# Patient Record
Sex: Female | Born: 1995 | Race: White | Hispanic: No | Marital: Single | Smoking: Never smoker
Health system: Southern US, Community
[De-identification: ages and names within clinical notes are randomized; demographics above are authoritative.]

---
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
## 2017-05-18 ENCOUNTER — Encounter (HOSPITAL_COMMUNITY): Payer: Self-pay

## 2017-05-18 ENCOUNTER — Other Ambulatory Visit: Payer: Self-pay

## 2017-05-18 ENCOUNTER — Emergency Department (HOSPITAL_COMMUNITY): Payer: Self-pay

## 2017-05-18 ENCOUNTER — Emergency Department (HOSPITAL_COMMUNITY)
Admission: EM | Admit: 2017-05-18 | Discharge: 2017-05-18 | Disposition: A | Discharge status: Home / Self Care | Payer: Self-pay | Attending: Emergency Medicine | Admitting: Emergency Medicine

## 2017-05-18 DIAGNOSIS — K529 Noninfective gastroenteritis and colitis, unspecified: Secondary | ICD-10-CM | POA: Insufficient documentation

## 2017-05-18 DIAGNOSIS — R1030 Lower abdominal pain, unspecified: Secondary | ICD-10-CM | POA: Insufficient documentation

## 2017-05-18 LAB — LIPASE, BLOOD: Lipase: 28 U/L (ref 11–51)

## 2017-05-18 LAB — CBC
HEMATOCRIT: 44.1 % (ref 36.0–46.0)
Hemoglobin: 14.2 g/dL (ref 12.0–15.0)
MCH: 27.2 pg (ref 26.0–34.0)
MCHC: 32.2 g/dL (ref 30.0–36.0)
MCV: 84.5 fL (ref 78.0–100.0)
Platelets: 338 10*3/uL (ref 150–400)
RBC: 5.22 MIL/uL — ABNORMAL HIGH (ref 3.87–5.11)
RDW: 14.4 % (ref 11.5–15.5)
WBC: 9.4 10*3/uL (ref 4.0–10.5)

## 2017-05-18 LAB — I-STAT BETA HCG BLOOD, ED (MC, WL, AP ONLY)

## 2017-05-18 LAB — COMPREHENSIVE METABOLIC PANEL
ALK PHOS: 65 U/L (ref 38–126)
ALT: 15 U/L (ref 14–54)
AST: 21 U/L (ref 15–41)
Albumin: 4.1 g/dL (ref 3.5–5.0)
Anion gap: 10 (ref 5–15)
BILIRUBIN TOTAL: 0.8 mg/dL (ref 0.3–1.2)
BUN: 10 mg/dL (ref 6–20)
CALCIUM: 9.3 mg/dL (ref 8.9–10.3)
CO2: 24 mmol/L (ref 22–32)
Chloride: 104 mmol/L (ref 101–111)
Creatinine, Ser: 0.77 mg/dL (ref 0.44–1.00)
GFR calc Af Amer: >60 mL/min (ref >60)
GFR calc non Af Amer: >60 mL/min (ref >60)
GLUCOSE: 122 mg/dL — AB (ref 65–99)
POTASSIUM: 4.6 mmol/L (ref 3.5–5.1)
Sodium: 138 mmol/L (ref 135–145)
TOTAL PROTEIN: 7.2 g/dL (ref 6.5–8.1)

## 2017-05-18 LAB — URINALYSIS, ROUTINE W REFLEX MICROSCOPIC
BACTERIA UA: NONE SEEN
Bilirubin Urine: NEGATIVE
Glucose, UA: NEGATIVE mg/dL
Ketones, ur: NEGATIVE mg/dL
Leukocytes, UA: NEGATIVE
NITRITE: NEGATIVE
PROTEIN: NEGATIVE mg/dL
SPECIFIC GRAVITY, URINE: 1.023 (ref 1.005–1.030)
pH: 6 (ref 5.0–8.0)

## 2017-05-18 MED ORDER — ACETAMINOPHEN 500 MG PO TABS
1000.0000 mg | ORAL_TABLET | Freq: Once | ORAL | Status: AC
  Administered 2017-05-18: 1000 mg via ORAL
  Filled 2017-05-18: qty 2

## 2017-05-18 MED ORDER — PROMETHAZINE HCL 25 MG PO TABS: 25.0000 mg | ORAL_TABLET | Freq: Four times a day (QID) | ORAL | 0 refills | Status: AC | PRN

## 2017-05-18 MED ORDER — PROMETHAZINE HCL 25 MG/ML IJ SOLN
25.0000 mg | Freq: Once | INTRAMUSCULAR | Status: AC
  Administered 2017-05-18: 25 mg via INTRAVENOUS
  Filled 2017-05-18: qty 1

## 2017-05-18 MED ORDER — SODIUM CHLORIDE 0.9 % IV SOLN
Freq: Once | INTRAVENOUS | Status: AC
  Administered 2017-05-18: 22:00:00 via INTRAVENOUS

## 2017-05-18 MED ORDER — SODIUM CHLORIDE 0.9 % IV BOLUS (SEPSIS)
2000.0000 mL | Freq: Once | INTRAVENOUS | Status: AC
  Administered 2017-05-18: 2000 mL via INTRAVENOUS

## 2017-05-18 MED ORDER — SODIUM CHLORIDE 0.9 % IV BOLUS (SEPSIS): 1000.0000 mL | Freq: Once | INTRAVENOUS | Status: DC

## 2017-05-18 MED ORDER — ONDANSETRON 4 MG PO TBDP
4.0000 mg | ORAL_TABLET | Freq: Once | ORAL | Status: AC | PRN
  Administered 2017-05-18: 4 mg via ORAL
  Filled 2017-05-18: qty 1

## 2017-05-18 MED ORDER — IOPAMIDOL (ISOVUE-300) INJECTION 61%
INTRAVENOUS | Status: AC
  Administered 2017-05-18: 100 mL
  Filled 2017-05-18: qty 100

## 2017-05-18 NOTE — ED Triage Notes (Signed)
Patient awoke 0500 with abdominal cramping and multiple episodes of vomiting. One episode of loose stool. Patient alert and oriented, states currently only nausea no pain.

## 2017-05-18 NOTE — ED Provider Notes (Signed)
[facility] Provider Note   CSN: [id number] Arrival date & time: 05/18/17  1227     History   Chief Complaint No chief complaint on file.   HPI Isabel Mccall is a 22 y.o. female.  HPI Patient presents to the emergency department with nausea vomiting abdominal cramping since around 5 AM.  The patient did not take any medications prior to arrival.  She states that her nausea continues even though she has not vomited in several hours.  The patient states that nothing seems to make the condition better or worse.  The patient denies chest pain, shortness of breath, headache,blurred vision, neck pain, fever, cough, weakness, numbness, dizziness, anorexia, edema,rash, back pain, dysuria, hematemesis, bloody stool, near syncope, or syncope. History reviewed. No pertinent past medical history.  There are no active problems to display for this patient.   History reviewed. No pertinent surgical history.   OB History   None      Home Medications    Prior to Admission medications   Medication Sig Start Date End Date Taking? Authorizing Provider  promethazine (PHENERGAN) 25 MG tablet Take 1 tablet (25 mg total) by mouth every 6 (six) hours as needed for nausea or vomiting. 05/18/17   Krithi Bray, Cristal Deerhristopher, PA-C    Family History No family history on file.  Social History Social History   Tobacco Use  . Smoking status: Never Smoker  . Smokeless tobacco: Never Used  Substance Use Topics  . Alcohol use: Not on file  . Drug use: Not on file     Allergies   Patient has no known allergies.   Review of Systems Review of Systems All other systems negative except as documented in the HPI. All pertinent positives and negatives as reviewed in the HPI.  Physical Exam Updated Vital Signs BP 124/86 (BP Location: Right Arm)   Pulse (!) 116   Temp (!) 100.8 F (38.2 C) (Oral)   Resp 16   Ht 5' 2.5" (1.588 m)   Wt 116.1 kg (256 lb)   SpO2  100%   BMI 46.08 kg/m   Physical Exam  Constitutional: She is oriented to person, place, and time. She appears well-developed and well-nourished. No distress.  HENT:  Head: Normocephalic and atraumatic.  Mouth/Throat: Oropharynx is clear and moist.  Eyes: Pupils are equal, round, and reactive to light.  Neck: Normal range of motion. Neck supple.  Cardiovascular: Normal rate, regular rhythm and normal heart sounds. Exam reveals no gallop and no friction rub.  No murmur heard. Pulmonary/Chest: Effort normal and breath sounds normal. No respiratory distress. She has no wheezes.  Abdominal: Soft. Bowel sounds are normal. She exhibits no distension and no mass. There is tenderness. There is no guarding.  Neurological: She is alert and oriented to person, place, and time. She exhibits normal muscle tone. Coordination normal.  Skin: Skin is warm and dry. Capillary refill takes less than 2 seconds. No rash noted. No erythema.  Psychiatric: She has a normal mood and affect. Her behavior is normal.  Nursing note and vitals reviewed.    ED Treatments / Results  Labs (all labs ordered are listed, but only abnormal results are displayed) Labs Reviewed  COMPREHENSIVE METABOLIC PANEL - Abnormal; Notable for the following components:      Result Value   Glucose, Bld 122 (*)    All other components within normal limits  CBC - Abnormal; Notable for the following components:   RBC 5.22 (*)  All other components within normal limits  URINALYSIS, ROUTINE W REFLEX MICROSCOPIC - Abnormal; Notable for the following components:   APPearance HAZY (*)    Hgb urine dipstick LARGE (*)    Squamous Epithelial / LPF 6-30 (*)    All other components within normal limits  LIPASE, BLOOD  I-STAT BETA HCG BLOOD, ED ([facility], [facility], [facility] ONLY)    EKG None  Radiology Ct Abdomen Pelvis W Contrast  Result Date: 05/18/2017 CLINICAL DATA:  Abdominal cramping with vomiting and epigastric pain radiating to back. EXAM: CT  ABDOMEN AND PELVIS WITH CONTRAST TECHNIQUE: Multidetector CT imaging of the abdomen and pelvis was performed using the standard protocol following bolus administration of intravenous contrast. CONTRAST:  ISOVUE-300 IOPAMIDOL (ISOVUE-300) INJECTION 61% COMPARISON:  None. FINDINGS: Lower chest: Normal. Hepatobiliary: Normal. Pancreas: Normal. Spleen: Normal. Adrenals/Urinary Tract: Adrenal glands are normal. Kidneys normal in size without hydronephrosis or nephrolithiasis. There is a 1 cm hypodensity over the upper pole cortex of the left kidney too small to characterize but likely a cyst. Ureters and bladder are normal. Stomach/Bowel: Stomach and small bowel are normal. Appendix is normal. Colon is normal. Vascular/Lymphatic: Normal. Reproductive: IUD in adequate position. Uterus and ovaries are otherwise unremarkable. Other: No free fluid or focal inflammatory change. Musculoskeletal: Normal. IMPRESSION: No acute findings in the abdomen/pelvis. 1 cm left renal cortical hypodensity too small to characterize but likely a cyst. Electronically Signed   By: Elberta Fortis M.D.   On: 05/18/2017 20:06    Procedures Procedures (including critical care time)  Medications Ordered in ED Medications  ondansetron (ZOFRAN-ODT) disintegrating tablet 4 mg (4 mg Oral Given 05/18/17 1250)  promethazine (PHENERGAN) injection 25 mg (25 mg Intravenous Given 05/18/17 1901)  sodium chloride 0.9 % bolus 2,000 mL (0 mLs Intravenous Stopped 05/18/17 2151)  iopamidol (ISOVUE-300) 61 % injection (100 mLs  Contrast Given 05/18/17 1917)  0.9 %  sodium chloride infusion ( Intravenous Stopped 05/18/17 2317)  acetaminophen (TYLENOL) tablet 1,000 mg (1,000 mg Oral Given 05/18/17 2205)     Initial Impression / Assessment and Plan / ED Course  I have reviewed the triage vital signs and the nursing notes.  Pertinent labs & imaging results that were available during my care of the patient were reviewed by me and considered in my  medical decision making (see chart for details).    Patient laboratory testing did not show any significant abnormalities and review of her CT scan did not show any significant abnormalities at this time.  I feel she is dealing with gastroenteritis and advised her to slowly increase her fluid intake and rest as much as possible.  I did advise her this could be an evolving process that is what fully declare itself.  Told to return for any worsening in her condition.  She agrees the plan and all questions were answered.  She does feel significantly better with no pain and no nausea at this time.  Patient is tolerated oral fluids  Final Clinical Impressions(s) / ED Diagnoses   Final diagnoses:  Gastroenteritis    ED Discharge Orders        Ordered    promethazine (PHENERGAN) 25 MG tablet  Every 6 hours PRN     05/18/17 2155       Charlestine Night, PA-C 05/19/17 0001    Rolan Bucco, MD 05/19/17 [phone]

## 2017-05-18 NOTE — ED Notes (Signed)
Upon DC pt HR noted to be upper 120's and pt had low grade fever, EDP aware. Verbal for tylenol and another liter NS.

## 2017-05-18 NOTE — Discharge Instructions (Addendum)
Return here as needed. Slowly increase your fluid intake. Rest as much as possible. °

## 2018-12-16 IMAGING — CT CT ABD-PELV W/ CM
2 of 4 series · 17 of 46 positions shown, 19 images · IV contrast (iopamidol)
Comparison: None.

CLINICAL DATA: Abdominal cramping with vomiting and epigastric pain
radiating to back.

EXAM:
CT ABDOMEN AND PELVIS WITH CONTRAST
TECHNIQUE: Multidetector CT imaging of the abdomen and pelvis was performed
using the standard protocol following bolus administration of
intravenous contrast.
CONTRAST:  100mL 4MYH3M-7AA IOPAMIDOL (4MYH3M-7AA) INJECTION 61%

[Series 3: a/p w/ 5mm · axial · 0.98mm/px · z∈[+801,+1246]mm · 14 of 97 slices shown, 16 images]
[im 4/97  soft-tissue]
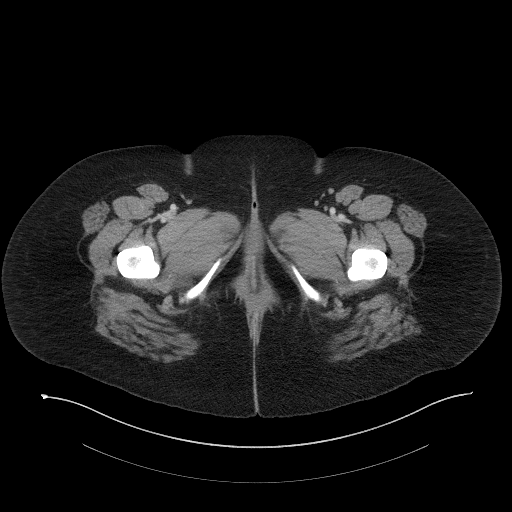
[im 4/97  bone]
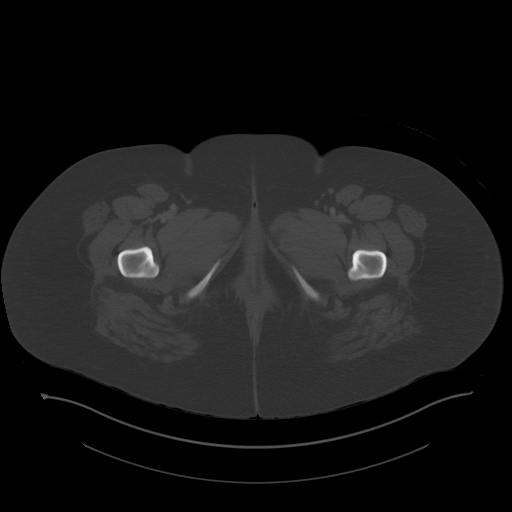
[im 12/97  soft-tissue]
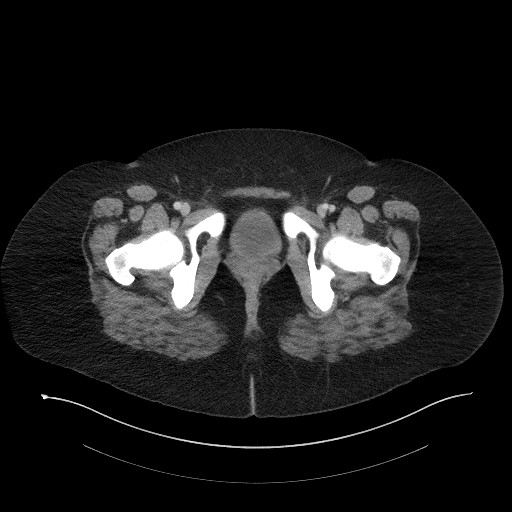
[im 20/97  soft-tissue]
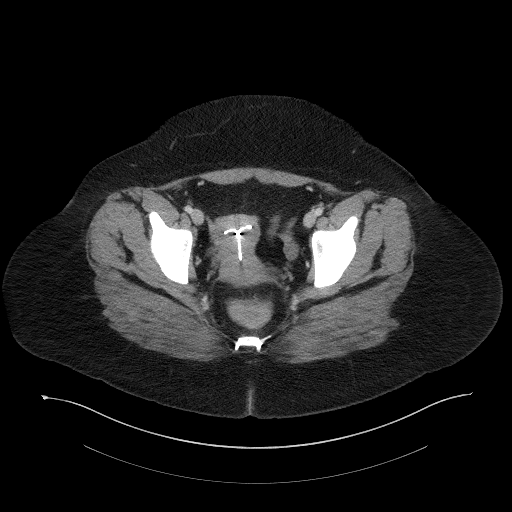
[im 27/97  soft-tissue]
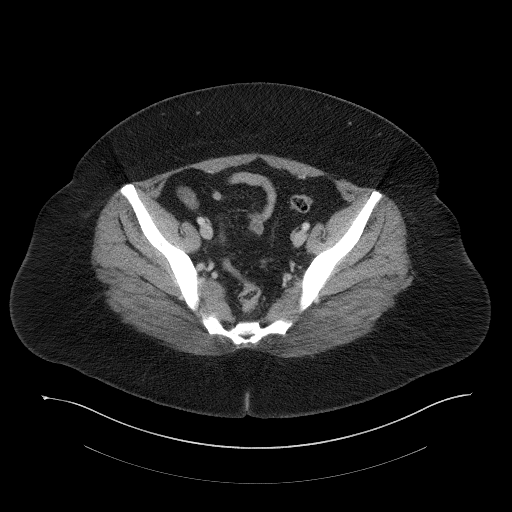
[im 31/97  soft-tissue]
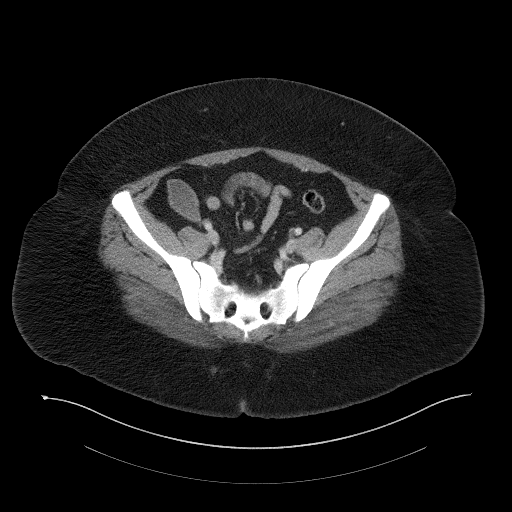
[im 39/97  soft-tissue]
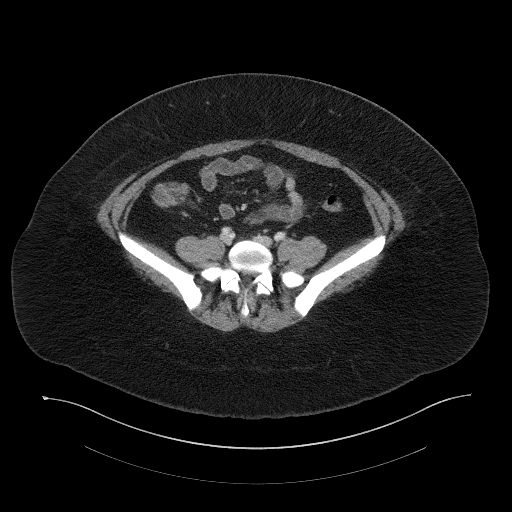
[im 47/97  soft-tissue]
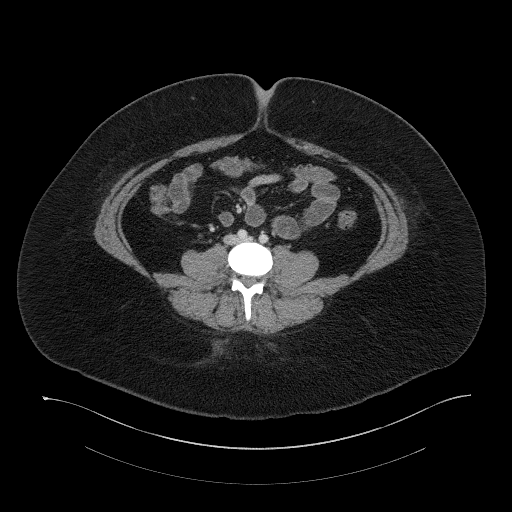
[im 50/97  soft-tissue]
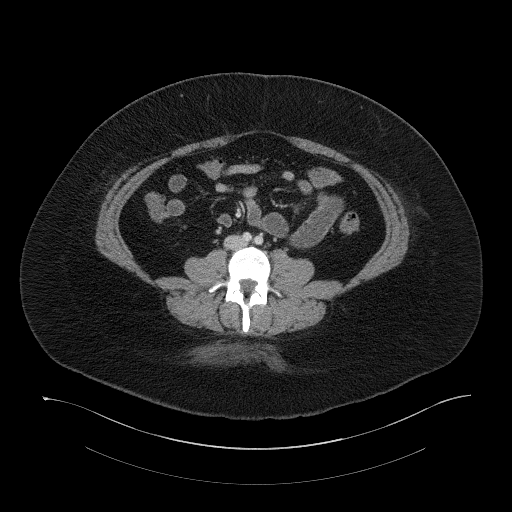
[im 58/97  soft-tissue]
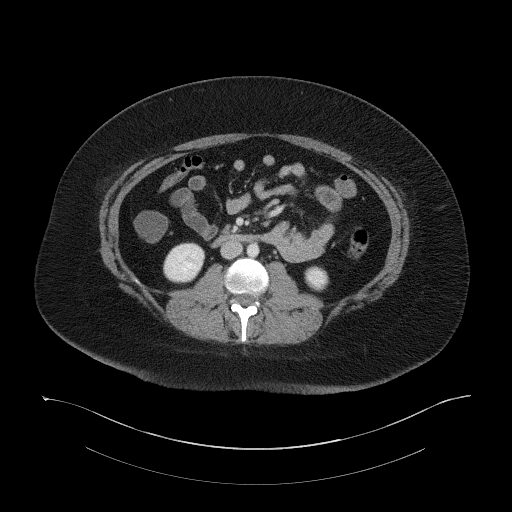
[im 58/97  bone]
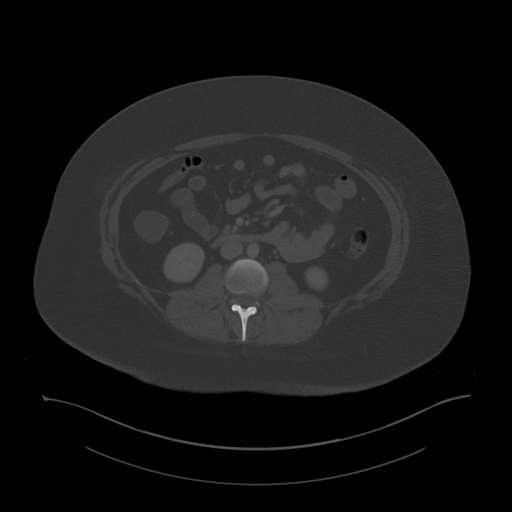
[im 66/97  soft-tissue]
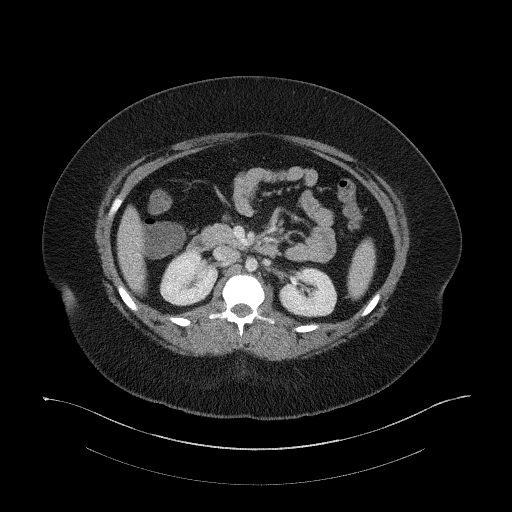
[im 73/97  soft-tissue]
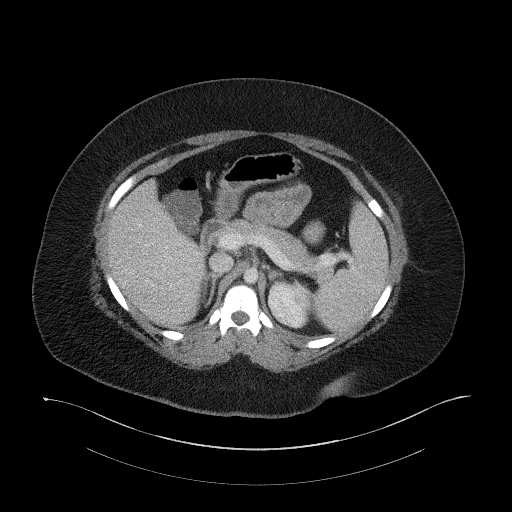
[im 77/97  soft-tissue]
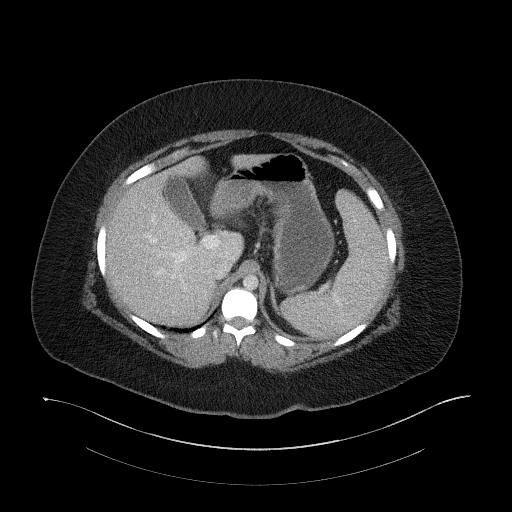
[im 85/97  soft-tissue]
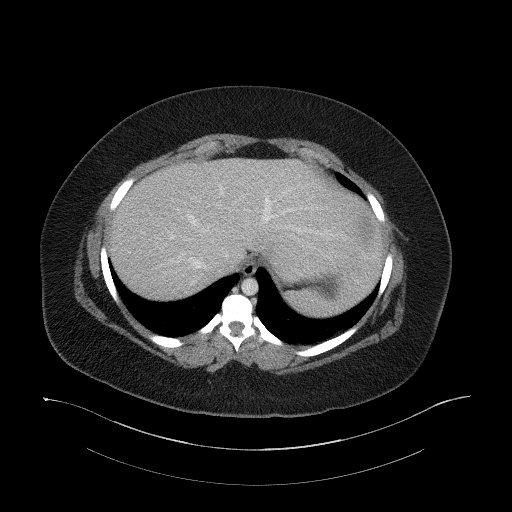
[im 93/97  soft-tissue]
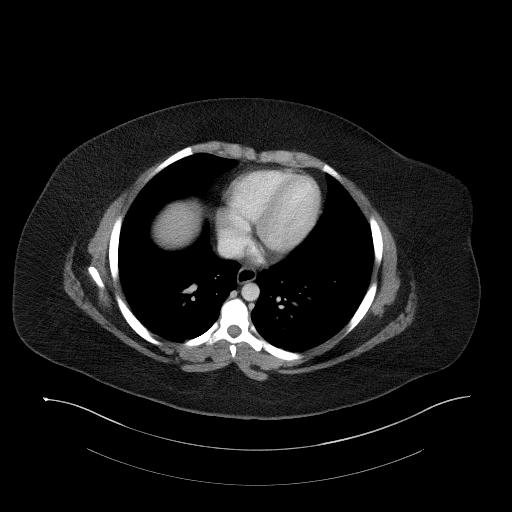

[Series 6: a/p w/ cor · coronal · 0.95mm/px · 3 of 162 slices shown]
[im 54/162  soft-tissue]
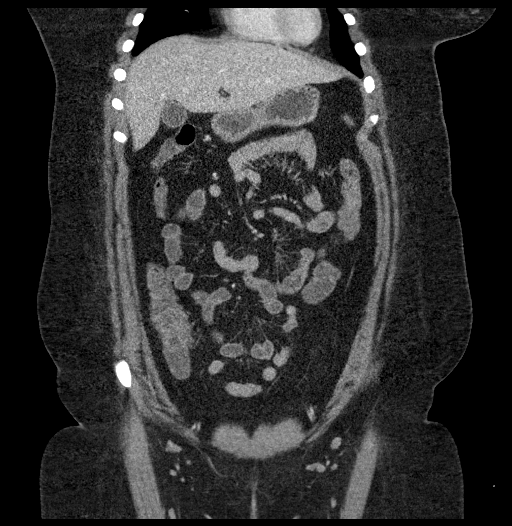
[im 72/162  soft-tissue]
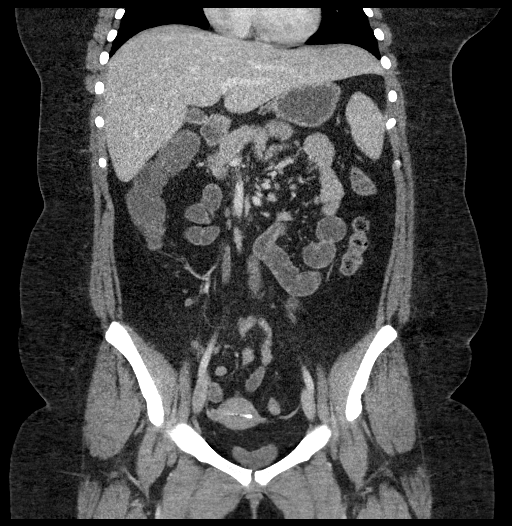
[im 90/162  soft-tissue]
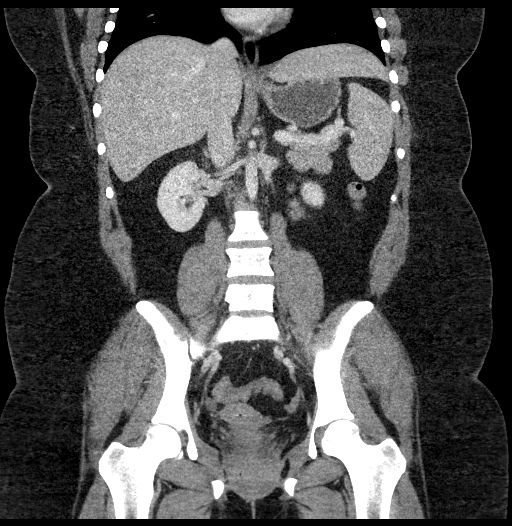

[17 of 46 positions shown; findings below may reference images not displayed]

FINDINGS: Lower chest: Normal.

Hepatobiliary: Normal.

Pancreas: Normal.

Spleen: Normal.

Adrenals/Urinary Tract: Adrenal glands are normal. Kidneys normal in
size without hydronephrosis or nephrolithiasis. There is a 1 cm
hypodensity over the upper pole cortex of the left kidney too small
to characterize but likely a cyst. Ureters and bladder are normal.

Stomach/Bowel: Stomach and small bowel are normal. Appendix is
normal. Colon is normal.

Vascular/Lymphatic: Normal.

Reproductive: IUD in adequate position. Uterus and ovaries are
otherwise unremarkable.

Other: No free fluid or focal inflammatory change.

Musculoskeletal: Normal.
IMPRESSION: No acute findings in the abdomen/pelvis.

1 cm left renal cortical hypodensity too small to characterize but
likely a cyst.

## 2019-07-01 ENCOUNTER — Other Ambulatory Visit (HOSPITAL_COMMUNITY): Payer: Self-pay | Admitting: Internal Medicine

## 2019-07-01 ENCOUNTER — Other Ambulatory Visit: Payer: Self-pay | Admitting: Internal Medicine

## 2019-07-01 DIAGNOSIS — R1030 Lower abdominal pain, unspecified: Secondary | ICD-10-CM

## 2021-02-03 ENCOUNTER — Other Ambulatory Visit: Payer: Self-pay

## 2021-02-03 ENCOUNTER — Encounter: Payer: Self-pay | Admitting: Emergency Medicine

## 2021-02-03 ENCOUNTER — Ambulatory Visit
Admission: EM | Admit: 2021-02-03 | Discharge: 2021-02-03 | Disposition: A | Discharge status: Home / Self Care | Payer: 59 | Attending: Urgent Care | Admitting: Urgent Care

## 2021-02-03 DIAGNOSIS — R052 Subacute cough: Secondary | ICD-10-CM

## 2021-02-03 DIAGNOSIS — B349 Viral infection, unspecified: Secondary | ICD-10-CM

## 2021-02-03 DIAGNOSIS — H9201 Otalgia, right ear: Secondary | ICD-10-CM

## 2021-02-03 DIAGNOSIS — J101 Influenza due to other identified influenza virus with other respiratory manifestations: Secondary | ICD-10-CM

## 2021-02-03 MED ORDER — BENZONATATE 100 MG PO CAPS: 100.0000 mg | ORAL_CAPSULE | Freq: Three times a day (TID) | ORAL | 0 refills | Status: AC | PRN

## 2021-02-03 MED ORDER — PSEUDOEPHEDRINE HCL 60 MG PO TABS: 60.0000 mg | ORAL_TABLET | Freq: Three times a day (TID) | ORAL | 0 refills | Status: AC | PRN

## 2021-02-03 MED ORDER — CETIRIZINE HCL 10 MG PO TABS: 10.0000 mg | ORAL_TABLET | Freq: Every day | ORAL | 0 refills | Status: AC

## 2021-02-03 MED ORDER — PROMETHAZINE-DM 6.25-15 MG/5ML PO SYRP: 5.0000 mL | ORAL_SOLUTION | Freq: Every evening | ORAL | 0 refills | Status: AC | PRN

## 2021-02-03 NOTE — ED Triage Notes (Signed)
History of flu over a week ago.  States she still has a lingering cough.  Cough productive at times.  Feels SOB at times. States right ear hurts.  Has been taking cough mediation without relief.

## 2021-02-03 NOTE — ED Provider Notes (Signed)
[facility]   MRN: [id number] DOB: Mar 19, 1995  Subjective:   Isabel Mccall is a 25 y.o. female presenting for evaluation of her persistent cough.  Patient was seen at a different urgent care in [place].  She tested positive for influenza.  She was prescribed an oral prednisone course but was not able to tolerate it and had a lot of vomiting with it so she stopped.  She is no longer had the symptoms.  She admits that she is feeling better but still has some difficulty with persistent coughing fits and transient shortness of breath from her coughing fits.  She has had bilateral ear pain worse over the right.  No active chest pain or shortness of breath, wheezing.  No history of respiratory disorders, smoking history.  No asthma.  No current facility-administered medications for this encounter.  Current Outpatient Medications:    promethazine (PHENERGAN) 25 MG tablet, Take 1 tablet (25 mg total) by mouth every 6 (six) hours as needed for nausea or vomiting., Disp: 10 tablet, Rfl: 0   No Known Allergies  History reviewed. No pertinent past medical history.   History reviewed. No pertinent surgical history.  History reviewed. No pertinent family history.  Social History   Tobacco Use   Smoking status: Never   Smokeless tobacco: Never    ROS   Objective:   Vitals: BP 132/83 (BP Location: Right Arm)   Pulse (!) 101   Temp 98.5 F (36.9 C) (Oral)   Resp 18   SpO2 97%   Physical Exam Constitutional:      General: She is not in acute distress.    Appearance: Normal appearance. She is well-developed. She is not ill-appearing, toxic-appearing or diaphoretic.  HENT:     Head: Normocephalic and atraumatic.     Right Ear: Tympanic membrane, ear canal and external ear normal. There is no impacted cerumen.     Left Ear: Tympanic membrane, ear canal and external ear normal. There is no impacted cerumen.     Nose: Congestion present. No rhinorrhea.      Mouth/Throat:     Mouth: Mucous membranes are moist.     Pharynx: No oropharyngeal exudate or posterior oropharyngeal erythema.  Eyes:     General: No scleral icterus.       Right eye: No discharge.        Left eye: No discharge.     Extraocular Movements: Extraocular movements intact.     Conjunctiva/sclera: Conjunctivae normal.     Pupils: Pupils are equal, round, and reactive to light.  Cardiovascular:     Rate and Rhythm: Normal rate and regular rhythm.     Pulses: Normal pulses.     Heart sounds: Normal heart sounds. No murmur heard.   No friction rub. No gallop.  Pulmonary:     Effort: Pulmonary effort is normal. No respiratory distress.     Breath sounds: Normal breath sounds. No stridor. No wheezing, rhonchi or rales.  Skin:    General: Skin is warm and dry.     Findings: No rash.  Neurological:     Mental Status: She is alert and oriented to person, place, and time.  Psychiatric:        Mood and Affect: Mood normal.        Behavior: Behavior normal.        Thought Content: Thought content normal.        Judgment: Judgment normal.    Assessment and Plan :  PDMP not reviewed this encounter.  1. Subacute cough   2. Acute viral syndrome   3. Influenza A   4. Right ear pain    Deferred imaging given clear cardiopulmonary exam, hemodynamically stable vital signs.  We will hold off on trying another round of prednisone as she did not tolerate this.  Recommended supportive care including the use of Zyrtec, pseudoephedrine to address postnasal drainage and global sinus inflammation as a source of her symptoms causing her cough and ear pain.  No further testing necessary at this time.  Counseled patient on potential for adverse effects with medications prescribed/recommended today, ER and return-to-clinic precautions discussed, patient verbalized understanding.    Wallis Bamberg, PA-C 02/03/21 1326
